# Patient Record
Sex: Male | Born: 1995
Health system: Southern US, Community
[De-identification: ages and names within clinical notes are randomized; demographics above are authoritative.]

---
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
## 2016-10-31 ENCOUNTER — Ambulatory Visit (INDEPENDENT_AMBULATORY_CARE_PROVIDER_SITE_OTHER): Payer: 59 | Admitting: Family Medicine

## 2016-10-31 ENCOUNTER — Encounter: Payer: Self-pay | Admitting: Family Medicine

## 2016-10-31 VITALS — BP 127/77 | HR 73 | Temp 97.5°F | Ht 68.0 in | Wt 243.6 lb

## 2016-10-31 DIAGNOSIS — Z0000 Encounter for general adult medical examination without abnormal findings: Secondary | ICD-10-CM | POA: Diagnosis not present

## 2016-10-31 DIAGNOSIS — H0260 Xanthelasma of unspecified eye, unspecified eyelid: Secondary | ICD-10-CM

## 2016-10-31 DIAGNOSIS — F411 Generalized anxiety disorder: Secondary | ICD-10-CM

## 2016-10-31 NOTE — Progress Notes (Signed)
BP 127/77   Pulse 73   Temp 97.5 F (36.4 C) (Oral)   Ht 5' 8"  (1.727 m)   Wt 243 lb 9.6 oz (110.5 kg)   BMI 37.04 kg/m    Subjective:    Patient ID: Thomas Mercer, male    DOB: November 17, 1995, 21 y.o.   MRN: [id number]  HPI: Thomas Mercer is a 21 y.o. male presenting on 10/31/2016 for Annual Exam   HPI Well adult exam Patient is coming in today for well adult exam. He has one spot on his upper eyelid that he wants to get looked at because he does not know what it is now has been there at least 4 or 5 months. He denies any pain or redness or warmth from the site or drainage from the site. He denies any chest pain, shortness of breath, headaches or vision issues, abdominal complaints, diarrhea, nausea, vomiting, or joint issues.   Anxiety Patient does say that he has some amount of anxiety and that he is a worry prone person but he says it is not to the point where it keeps him from functioning in his life or lifestyle. He does say that it occasionally he wakes up at night because of the anxiety but does not feel as to the point where he needs medication or counseling to help with that. He denies any feelings of depression or sadness or suicidal ideations or feelings of hopelessness or helplessness. He would like to get some labs done as routine to make sure he doesn't have thyroid disease which runs in his family.  Relevant past medical, surgical, family and social history reviewed and updated as indicated. Interim medical history since our last visit reviewed. Allergies and medications reviewed and updated.  Review of Systems  Constitutional: Negative for chills and fever.  HENT: Negative for congestion, ear pain and tinnitus.   Eyes: Negative for pain.  Respiratory: Negative for cough, shortness of breath and wheezing.   Cardiovascular: Negative for chest pain, palpitations and leg swelling.  Gastrointestinal: Negative for abdominal pain, blood in stool, constipation and  diarrhea.  Genitourinary: Negative for dysuria and hematuria.  Musculoskeletal: Negative for back pain and myalgias.  Skin: Negative for rash.  Neurological: Negative for dizziness, weakness and headaches.  Psychiatric/Behavioral: Positive for sleep disturbance. Negative for decreased concentration, dysphoric mood, self-injury and suicidal ideas. The patient is nervous/anxious.     Per HPI unless specifically indicated above   Allergies as of 10/31/2016   No Known Allergies     Medication List    as of 10/31/2016  5:09 PM   You have not been prescribed any medications.        Objective:    BP 127/77   Pulse 73   Temp 97.5 F (36.4 C) (Oral)   Ht 5' 8"  (1.727 m)   Wt 243 lb 9.6 oz (110.5 kg)   BMI 37.04 kg/m   Wt Readings from Last 3 Encounters:  10/31/16 243 lb 9.6 oz (110.5 kg)    Physical Exam  Constitutional: He is oriented to person, place, and time. He appears well-developed and well-nourished. No distress.  HENT:  Right Ear: External ear normal.  Left Ear: External ear normal.  Nose: Nose normal.  Mouth/Throat: Oropharynx is clear and moist. No oropharyngeal exudate.  Eyes: Conjunctivae and EOM are normal. Pupils are equal, round, and reactive to light. Right eye exhibits no discharge. No scleral icterus.  Neck: Neck supple. No thyromegaly present.  Cardiovascular: Normal rate, regular  rhythm, normal heart sounds and intact distal pulses.   No murmur heard. Pulmonary/Chest: Effort normal and breath sounds normal. No respiratory distress. He has no wheezes.  Abdominal: Soft. Bowel sounds are normal. He exhibits no distension. There is no tenderness. There is no rebound and no guarding.  Musculoskeletal: Normal range of motion. He exhibits no edema.  Lymphadenopathy:    He has no cervical adenopathy.  Neurological: He is alert and oriented to person, place, and time. Coordination normal.  Skin: Skin is warm and dry. Lesion (She'll showed cutaneous mobile soft  nodule in right upper outer eyelid. Less than 0.2 cm in diameter. Nontender to palpation, no erythema or drainage and no signs of opening in under eyelid either.) noted. No rash noted. He is not diaphoretic.  Psychiatric: His behavior is normal. Judgment normal. His mood appears anxious. He does not exhibit a depressed mood. He expresses no suicidal ideation. He expresses no suicidal plans.  Vitals reviewed.   No results found for this or any previous visit.    Assessment & Plan:   Problem List Items Addressed This Visit    None    Visit Diagnoses    Well adult exam    -  Primary   Relevant Orders   CMP14+EGFR (Completed)   Lipid panel (Completed)   Xanthelasma       Anxiety state       Relevant Orders   CBC with Differential/Platelet (Completed)   TSH (Completed)       Follow up plan: Return if symptoms worsen or fail to improve.  Counseling provided for all of the vaccine components Orders Placed This Encounter  Procedures  . CMP14+EGFR  . CBC with Differential/Platelet  . TSH  . Lipid panel    Caryl Pina, MD [facility] 10/31/2016, 5:09 PM

## 2016-11-01 LAB — CBC WITH DIFFERENTIAL/PLATELET
BASOS ABS: 0.1 10*3/uL (ref 0.0–0.2)
Basos: 0 %
EOS (ABSOLUTE): 0.1 10*3/uL (ref 0.0–0.4)
Eos: 1 %
HEMOGLOBIN: 13.7 g/dL (ref 13.0–17.7)
Hematocrit: 40.8 % (ref 37.5–51.0)
Immature Grans (Abs): 0 10*3/uL (ref 0.0–0.1)
Immature Granulocytes: 0 %
LYMPHS ABS: 4.3 10*3/uL — AB (ref 0.7–3.1)
Lymphs: 27 %
MCH: 27.8 pg (ref 26.6–33.0)
MCHC: 33.6 g/dL (ref 31.5–35.7)
MCV: 83 fL (ref 79–97)
MONOCYTES: 9 %
MONOS ABS: 1.4 10*3/uL — AB (ref 0.1–0.9)
NEUTROS PCT: 63 %
Neutrophils Absolute: 9.8 10*3/uL — ABNORMAL HIGH (ref 1.4–7.0)
Platelets: 486 10*3/uL — ABNORMAL HIGH (ref 150–379)
RBC: 4.93 x10E6/uL (ref 4.14–5.80)
RDW: 13 % (ref 12.3–15.4)
WBC: 15.6 10*3/uL — AB (ref 3.4–10.8)

## 2016-11-01 LAB — CMP14+EGFR
ALBUMIN: 5.1 g/dL (ref 3.5–5.5)
ALK PHOS: 90 IU/L (ref 39–117)
ALT: 37 IU/L (ref 0–44)
AST: 26 IU/L (ref 0–40)
Albumin/Globulin Ratio: 1.6 (ref 1.2–2.2)
BILIRUBIN TOTAL: 0.6 mg/dL (ref 0.0–1.2)
BUN / CREAT RATIO: 18 (ref 9–20)
BUN: 13 mg/dL (ref 6–20)
CO2: 24 mmol/L (ref 18–29)
Calcium: 10.2 mg/dL (ref 8.7–10.2)
Chloride: 98 mmol/L (ref 96–106)
Creatinine, Ser: 0.72 mg/dL — ABNORMAL LOW (ref 0.76–1.27)
GFR calc Af Amer: 155 mL/min/{1.73_m2} (ref >59)
GFR calc non Af Amer: 134 mL/min/{1.73_m2} (ref >59)
GLUCOSE: 88 mg/dL (ref 65–99)
Globulin, Total: 3.1 g/dL (ref 1.5–4.5)
Potassium: 4.3 mmol/L (ref 3.5–5.2)
Sodium: 140 mmol/L (ref 134–144)
Total Protein: 8.2 g/dL (ref 6.0–8.5)

## 2016-11-01 LAB — LIPID PANEL
CHOLESTEROL TOTAL: 162 mg/dL (ref 100–199)
Chol/HDL Ratio: 3.9 ratio units (ref 0.0–5.0)
HDL: 42 mg/dL (ref >39)
LDL Calculated: 89 mg/dL (ref 0–99)
Triglycerides: 156 mg/dL — ABNORMAL HIGH (ref 0–149)
VLDL Cholesterol Cal: 31 mg/dL (ref 5–40)

## 2016-11-01 LAB — TSH: TSH: 4.01 u[IU]/mL (ref 0.450–4.500)

## 2016-11-03 ENCOUNTER — Encounter: Payer: Self-pay | Admitting: Family Medicine

## 2016-12-02 ENCOUNTER — Encounter: Payer: Self-pay | Admitting: Family Medicine

## 2016-12-02 ENCOUNTER — Other Ambulatory Visit: Payer: Self-pay

## 2016-12-02 ENCOUNTER — Other Ambulatory Visit: Payer: 59

## 2016-12-02 DIAGNOSIS — D72829 Elevated white blood cell count, unspecified: Secondary | ICD-10-CM | POA: Diagnosis not present

## 2016-12-02 DIAGNOSIS — R5383 Other fatigue: Secondary | ICD-10-CM

## 2016-12-03 ENCOUNTER — Other Ambulatory Visit: Payer: Self-pay

## 2016-12-03 ENCOUNTER — Telehealth: Payer: Self-pay | Admitting: Family Medicine

## 2016-12-03 DIAGNOSIS — R5383 Other fatigue: Secondary | ICD-10-CM | POA: Diagnosis not present

## 2016-12-03 LAB — CBC WITH DIFFERENTIAL/PLATELET
Basophils Absolute: 0.1 10*3/uL (ref 0.0–0.2)
Basos: 0 %
EOS (ABSOLUTE): 0.1 10*3/uL (ref 0.0–0.4)
EOS: 1 %
HEMATOCRIT: 42.3 % (ref 37.5–51.0)
Hemoglobin: 14.4 g/dL (ref 13.0–17.7)
Immature Grans (Abs): 0 10*3/uL (ref 0.0–0.1)
Immature Granulocytes: 0 %
LYMPHS ABS: 4.3 10*3/uL — AB (ref 0.7–3.1)
Lymphs: 38 %
MCH: 28.9 pg (ref 26.6–33.0)
MCHC: 34 g/dL (ref 31.5–35.7)
MCV: 85 fL (ref 79–97)
MONOS ABS: 0.9 10*3/uL (ref 0.1–0.9)
Monocytes: 8 %
Neutrophils Absolute: 6.1 10*3/uL (ref 1.4–7.0)
Neutrophils: 53 %
PLATELETS: 446 10*3/uL — AB (ref 150–379)
RBC: 4.98 x10E6/uL (ref 4.14–5.80)
RDW: 13.5 % (ref 12.3–15.4)
WBC: 11.4 10*3/uL — AB (ref 3.4–10.8)

## 2016-12-03 NOTE — Telephone Encounter (Signed)
I do think that they should keep the appointment with me on Friday we can discuss things more, the reason I am sending them to the hematologist is to make sure that things like leukemia are not a possibility, I do not think based on the numbers that that's what were seen but that's what I want to rule out. But discussing anxiety on Friday would be a good idea

## 2016-12-03 NOTE — Telephone Encounter (Signed)
Mother advised, will come in Friday to see Dr. Warrick Parisian

## 2016-12-03 NOTE — Telephone Encounter (Signed)
Mom thought of a couple more questions after speaking with you.  She said Thomas Mercer has an appointment with you on Friday to discuss anxiety and his labwork, do you think they should keep this appointment?  And, they are very concerned about what could be wrong.  Thomas Mercer has been looking up things on the internet and everything he sees is bad.  I tried to reassure her that an elevated WBC count and platelets are not always something horrible but she wants to know if you think it could be something that's not as serious as leukemia.  Basically wants your reassurance that this can happen and it isn't always something horrible.  Please advise.

## 2016-12-04 ENCOUNTER — Other Ambulatory Visit: Payer: Self-pay

## 2016-12-04 ENCOUNTER — Telehealth: Payer: Self-pay | Admitting: Family Medicine

## 2016-12-04 DIAGNOSIS — D72829 Elevated white blood cell count, unspecified: Secondary | ICD-10-CM

## 2016-12-04 LAB — VITAMIN B12: VITAMIN B 12: 533 pg/mL (ref 232–1245)

## 2016-12-04 LAB — VITAMIN D 25 HYDROXY (VIT D DEFICIENCY, FRACTURES): VIT D 25 HYDROXY: 26.3 ng/mL — AB (ref 30.0–100.0)

## 2016-12-04 NOTE — Telephone Encounter (Signed)
What type of referral do you need? Hematologist  Have you been seen at our office for this problem? Yes  (If no, schedule them an appointment.  They will need to be seen before a referral can be done.)  Is there a particular doctor or location that you prefer? No but need one ASAP  Patient notified that referrals can take up to a week or longer to process. If they haven't heard anything within a week they should call back and speak with the referral department.

## 2016-12-04 NOTE — Telephone Encounter (Signed)
Pt aware to be expecting a phone call from [facility]

## 2016-12-05 ENCOUNTER — Encounter: Payer: Self-pay | Admitting: Family Medicine

## 2016-12-05 ENCOUNTER — Ambulatory Visit (INDEPENDENT_AMBULATORY_CARE_PROVIDER_SITE_OTHER): Payer: 59 | Admitting: Family Medicine

## 2016-12-05 ENCOUNTER — Ambulatory Visit: Payer: 59 | Admitting: Family Medicine

## 2016-12-05 VITALS — BP 129/75 | HR 72 | Temp 98.2°F | Ht 68.0 in | Wt 239.0 lb

## 2016-12-05 DIAGNOSIS — F411 Generalized anxiety disorder: Secondary | ICD-10-CM

## 2016-12-05 MED ORDER — BUSPIRONE HCL 10 MG PO TABS: 10.0000 mg | ORAL_TABLET | Freq: Two times a day (BID) | ORAL | 1 refills | Status: DC

## 2016-12-05 MED ORDER — TRAZODONE HCL 50 MG PO TABS: 25.0000 mg | ORAL_TABLET | Freq: Every evening | ORAL | 3 refills | Status: DC | PRN

## 2016-12-05 NOTE — Progress Notes (Signed)
BP 129/75   Pulse 72   Temp 98.2 F (36.8 C) (Oral)   Ht 5\' 8"  (1.727 m)   Wt 239 lb (108.4 kg)   BMI 36.34 kg/m    Subjective:    Patient ID: Thomas Mercer, male    DOB: 01/17/1996, 21 y.o.   MRN: [id number]  HPI: Thomas Mercer is a 21 y.o. male presenting on 12/05/2016 for Anxiety (x 2 weeks )   HPI Anxiety state patient has had a persistently elevated white blood cell count and is coming in today because he has been having  Lot of anxiety more than normal because of this that he is dealing with.e denies any suicidal ideations.     Depression screen [facility] 2/9 12/05/2016 10/31/2016  Decreased Interest 2 0  Down, Depressed, Hopeless 2 1  PHQ - 2 Score 4 1  Altered sleeping 2 -  Tired, decreased energy 2 -  Change in appetite 0 -  Feeling bad or failure about yourself  0 -  Trouble concentrating 0 -  Moving slowly or fidgety/restless 0 -  Suicidal thoughts 0 -  PHQ-9 Score 8 -    Feeling nervous, anxious or on edge 2 Not being able to stop or control worrying 2 Worrying too much about different things 2 Trouble relaxing 2 Being so restless that it is hard to sit still 2 Becoming easily annoyed or irritable 1 Feeling afraid as if something awful might happen 3         Total Score 14   Relevant past medical, surgical, family and social history reviewed and updated as indicated. Interim medical history since our last visit reviewed. Allergies and medications reviewed and updated.  Review of Systems  Constitutional: Negative for chills and fever.  Respiratory: Negative for shortness of breath and wheezing.   Cardiovascular: Negative for chest pain and leg swelling.  Musculoskeletal: Negative for back pain and gait problem.  Skin: Negative for rash.  Psychiatric/Behavioral: Positive for dysphoric mood and sleep disturbance. Negative for decreased concentration, self-injury and suicidal ideas. The patient is nervous/anxious.   All other systems reviewed and are  negative.   Per HPI unless specifically indicated above      Objective:    BP 129/75   Pulse 72   Temp 98.2 F (36.8 C) (Oral)   Ht 5\' 8"  (1.727 m)   Wt 239 lb (108.4 kg)   BMI 36.34 kg/m   Wt Readings from Last 3 Encounters:  12/05/16 239 lb (108.4 kg)  10/31/16 243 lb 9.6 oz (110.5 kg)    Physical Exam  Constitutional: He is oriented to person, place, and time. He appears well-developed and well-nourished. No distress.  Eyes: Conjunctivae are normal. Right eye exhibits no discharge. Left eye exhibits no discharge. No scleral icterus.  Neck: Neck supple. No thyromegaly present.  Musculoskeletal: Normal range of motion. He exhibits no edema.  Lymphadenopathy:    He has no cervical adenopathy.  Neurological: He is alert and oriented to person, place, and time. Coordination normal.  Skin: Skin is warm and dry. No rash noted. He is not diaphoretic.  Psychiatric: His behavior is normal. Thought content normal. His mood appears anxious. He does not exhibit a depressed mood. He expresses no suicidal ideation. He expresses no suicidal plans.  Nursing note and vitals reviewed.     Assessment & Plan:   Problem List Items Addressed This Visit    None    Visit Diagnoses    Anxiety state    -  Primary   Relevant Medications   busPIRone (BUSPAR) 10 MG tablet   traZODone (DESYREL) 50 MG tablet       Follow up plan: Return in about 2 months (around 02/02/2017), or if symptoms worsen or fail to improve, for Follow-up anxiety.  Counseling provided for all of the vaccine components No orders of the defined types were placed in this encounter.   Caryl Pina, MD [facility] 12/05/2016, 4:45 PM

## 2016-12-09 ENCOUNTER — Encounter (HOSPITAL_COMMUNITY): Payer: 59 | Attending: Oncology | Admitting: Oncology

## 2016-12-09 ENCOUNTER — Encounter (HOSPITAL_COMMUNITY): Payer: Self-pay

## 2016-12-09 ENCOUNTER — Encounter (HOSPITAL_COMMUNITY): Payer: 59

## 2016-12-09 DIAGNOSIS — Z79899 Other long term (current) drug therapy: Secondary | ICD-10-CM | POA: Diagnosis not present

## 2016-12-09 DIAGNOSIS — D75838 Other thrombocytosis: Secondary | ICD-10-CM | POA: Insufficient documentation

## 2016-12-09 DIAGNOSIS — R7989 Other specified abnormal findings of blood chemistry: Secondary | ICD-10-CM | POA: Insufficient documentation

## 2016-12-09 DIAGNOSIS — E559 Vitamin D deficiency, unspecified: Secondary | ICD-10-CM | POA: Insufficient documentation

## 2016-12-09 DIAGNOSIS — D473 Essential (hemorrhagic) thrombocythemia: Secondary | ICD-10-CM | POA: Diagnosis not present

## 2016-12-09 DIAGNOSIS — D72829 Elevated white blood cell count, unspecified: Secondary | ICD-10-CM | POA: Diagnosis not present

## 2016-12-09 LAB — FERRITIN: Ferritin: 128 ng/mL (ref 24–336)

## 2016-12-09 LAB — CBC WITH DIFFERENTIAL/PLATELET
Basophils Absolute: 0 10*3/uL (ref 0.0–0.1)
Basophils Relative: 1 %
EOS PCT: 1 %
Eosinophils Absolute: 0.1 10*3/uL (ref 0.0–0.7)
HEMATOCRIT: 43.6 % (ref 39.0–52.0)
Hemoglobin: 15.4 g/dL (ref 13.0–17.0)
LYMPHS ABS: 2.6 10*3/uL (ref 0.7–4.0)
LYMPHS PCT: 30 %
MCH: 29.7 pg (ref 26.0–34.0)
MCHC: 35.3 g/dL (ref 30.0–36.0)
MCV: 84.2 fL (ref 78.0–100.0)
MONO ABS: 0.8 10*3/uL (ref 0.1–1.0)
MONOS PCT: 9 %
NEUTROS ABS: 5.2 10*3/uL (ref 1.7–7.7)
Neutrophils Relative %: 59 %
PLATELETS: 451 10*3/uL — AB (ref 150–400)
RBC: 5.18 MIL/uL (ref 4.22–5.81)
RDW: 12.8 % (ref 11.5–15.5)
WBC: 8.7 10*3/uL (ref 4.0–10.5)

## 2016-12-09 LAB — IRON AND TIBC
IRON: 110 ug/dL (ref 45–182)
SATURATION RATIOS: 23 % (ref 17.9–39.5)
TIBC: 472 ug/dL — AB (ref 250–450)
UIBC: 362 ug/dL

## 2016-12-09 LAB — COMPREHENSIVE METABOLIC PANEL
ALT: 33 U/L (ref 17–63)
ANION GAP: 8 (ref 5–15)
AST: 26 U/L (ref 15–41)
Albumin: 4.8 g/dL (ref 3.5–5.0)
Alkaline Phosphatase: 76 U/L (ref 38–126)
BILIRUBIN TOTAL: 0.9 mg/dL (ref 0.3–1.2)
BUN: 12 mg/dL (ref 6–20)
CHLORIDE: 102 mmol/L (ref 101–111)
CO2: 26 mmol/L (ref 22–32)
Calcium: 10 mg/dL (ref 8.9–10.3)
Creatinine, Ser: 0.67 mg/dL (ref 0.61–1.24)
Glucose, Bld: 109 mg/dL — ABNORMAL HIGH (ref 65–99)
Potassium: 4.3 mmol/L (ref 3.5–5.1)
Sodium: 136 mmol/L (ref 135–145)
TOTAL PROTEIN: 8.6 g/dL — AB (ref 6.5–8.1)

## 2016-12-09 LAB — LACTATE DEHYDROGENASE: LDH: 112 U/L (ref 98–192)

## 2016-12-09 NOTE — Patient Instructions (Signed)
[place] at [facility] Discharge Instructions  RECOMMENDATIONS MADE BY THE CONSULTANT AND ANY TEST RESULTS WILL BE SENT TO YOUR REFERRING PHYSICIAN.  You were seen today by Dr. Twana First Follow up in clinic in 10 days to review labs See Amy up front for appointments   Thank you for choosing [place] at [facility] to provide your oncology and hematology care.  To afford each patient quality time with our provider, please arrive at least 15 minutes before your scheduled appointment time.    If you have a lab appointment with the [facility] please come in thru the  Main Entrance and check in at the main information desk  You need to re-schedule your appointment should you arrive 10 or more minutes late.  We strive to give you quality time with our providers, and arriving late affects you and other patients whose appointments are after yours.  Also, if you no show three or more times for appointments you may be dismissed from the clinic at the providers discretion.     Again, thank you for choosing [facility].  Our hope is that these requests will decrease the amount of time that you wait before being seen by our physicians.       _____________________________________________________________  Should you have questions after your visit to [facility], please contact our office at [phone] between the hours of 8:30 a.m. and 4:30 p.m.  Voicemails left after 4:30 p.m. will not be returned until the following business day.  For prescription refill requests, have your pharmacy contact our office.       Resources For Cancer Patients and their Caregivers ? American Cancer Society: Can assist with transportation, wigs, general needs, runs Look Good Feel Better.        (602) 080-5547 ? Cancer Care: Provides financial assistance, online support groups, medication/co-pay assistance.  1-800-813-HOPE  614 640 2224) ? [facility] Assists [place] cancer patients and their families through emotional , educational and financial support.  949-521-8841 ? [place] DSS Where to apply for food stamps, Medicaid and utility assistance. 508-470-0510 ? [facility]: Transportation to medical appointments. (304) 150-8366 ? Social Security Administration: May apply for disability if have a Stage IV cancer. 3315555669 248-647-2762 ? [employer], Disability and Transit Services: Assists with nutrition, care and transit needs. [place] Support Programs: @10RELATIVEDAYS @ > Cancer Support Group  2nd Tuesday of the month 1pm-2pm, Journey Room  > Creative Journey  3rd Tuesday of the month 1130am-1pm, Journey Room  > Look Good Feel Better  1st Wednesday of the month 10am-12 noon, Journey Room (Call [place] to register [phone])

## 2016-12-09 NOTE — Progress Notes (Signed)
[place] NOTE  Patient Care Team: Thomas Rancher, MD as PCP - General (Family Medicine)  CHIEF COMPLAINTS/PURPOSE OF CONSULTATION:  Leukocytosis  HISTORY OF PRESENTING ILLNESS:  Thomas Mercer 21 y.o. male is here because of referral by Dr. Warrick Parisian for leukocytosis.  He had routine CBC performed on 10/31/16 which demonstrated a WBC of 15.7k, hemoglobin 13.7 g/dl, plt 486k. He was told that he "needed to be ruled out for leukemia".  His most recent lab results on 12/02/2016 show his WBC at 11,400/uL, HCT at 42.3%, MCV at 85 fL, and platelets 446,000/ uL.   Patient presents today accompanied by his mother for consultation about his leukocytosis. I personally reviewed and went over labs with the patient.  He states he has not had any recent infections. He does note he has a cavity that needs to be filled. States he has not been on any antibiotics recently.   The mother notes that her son has been very anxious about possibility of leukemia. Since hearing about possibly having leukemia from his PCP he has not been able to sleep, has been having heart palpitations, and been anxious .   He is currently taking 5000 units of Vitamin D x2 a day for vitamin D deficiency, but otherwise does not take any medications.  Denies cough, fever, unexplained weight loss, night sweats, diarhrea, GI issues, abdominal pain, and leg swelling.  Patient states he doesn't smoke.    MEDICAL HISTORY:  No past medical history on file. None  SURGICAL HISTORY: No past surgical history on file. None  SOCIAL HISTORY: Social History   Social History  . Marital status: Single    Spouse name: N/A  . Number of children: N/A  . Years of education: N/A   Occupational History  . Not on file.   Social History Main Topics  . Smoking status: Never Smoker  . Smokeless tobacco: Never Used  . Alcohol use No  . Drug use: No  . Sexual activity: No   Other Topics Concern  .  Not on file   Social History Narrative  . No narrative on file    FAMILY HISTORY: Family History  Problem Relation Age of Onset  . Diabetes Father   . Hypertension Father   . Cancer Maternal Grandfather     renal    ALLERGIES:  has No Known Allergies.  MEDICATIONS:  Current Outpatient Prescriptions  Medication Sig Dispense Refill  . busPIRone (BUSPAR) 10 MG tablet Take 1 tablet (10 mg total) by mouth 2 (two) times daily. 60 tablet 1  . traZODone (DESYREL) 50 MG tablet Take 0.5-1 tablets (25-50 mg total) by mouth at bedtime as needed for sleep. 30 tablet 3   No current facility-administered medications for this visit.      Review of Systems  Constitutional: Negative.  Negative for fever and weight loss.       Denies night sweats  HENT: Negative.   Eyes: Negative.   Respiratory: Negative.  Negative for cough.   Cardiovascular: Positive for palpitations (due to anxiety). Negative for leg swelling.  Gastrointestinal: Negative.  Negative for abdominal pain and diarrhea.       Denies GI issues.  Genitourinary: Negative.   Musculoskeletal: Negative.   Skin: Negative.   Neurological: Negative.   Endo/Heme/Allergies: Negative.   Psychiatric/Behavioral: The patient is nervous/anxious and has insomnia (due to anxiety).   All other systems reviewed and are negative.  14 point ROS was done and is otherwise  as detailed above or in HPI   PHYSICAL EXAMINATION:   Vitals:   12/09/16 0856  BP: (!) 164/67  Pulse: 68  Resp: 18  Temp: 98.3 F (36.8 C)   Filed Weights   12/09/16 0856  Weight: 238 lb 4.8 oz (108.1 kg)     Physical Exam  Constitutional: He is oriented to person, place, and time and well-developed, well-nourished, and in no distress. No distress.  HENT:  Head: Normocephalic and atraumatic.  Mouth/Throat: No oropharyngeal exudate.  Eyes: Conjunctivae and EOM are normal. Pupils are equal, round, and reactive to light. No scleral icterus.  Neck: Normal range  of motion. Neck supple. No JVD present.  Cardiovascular: Normal rate, regular rhythm and normal heart sounds.  Exam reveals no gallop and no friction rub.   No murmur heard. Pulmonary/Chest: Effort normal and breath sounds normal. No respiratory distress. He has no wheezes. He has no rales.  Abdominal: Soft. Bowel sounds are normal. He exhibits no distension. There is no hepatosplenomegaly. There is no tenderness. There is no guarding.  Musculoskeletal: Normal range of motion. He exhibits no edema or tenderness.  Lymphadenopathy:       Head (right side): No submental, no submandibular, no tonsillar, no preauricular, no posterior auricular and no occipital adenopathy present.       Head (left side): No submental, no submandibular, no tonsillar, no preauricular, no posterior auricular and no occipital adenopathy present.    He has no cervical adenopathy.       Right cervical: No superficial cervical, no deep cervical and no posterior cervical adenopathy present.      Left cervical: No superficial cervical, no deep cervical and no posterior cervical adenopathy present.    He has no axillary adenopathy.       Right: No supraclavicular adenopathy present.       Left: No supraclavicular adenopathy present.  Neurological: He is alert and oriented to person, place, and time. No cranial nerve deficit. Gait normal.  Skin: Skin is warm and dry. No rash noted. No erythema. No pallor.  Psychiatric: Affect and judgment normal.  Nursing note and vitals reviewed.     LABORATORY DATA:  I have reviewed the data as listed Lab Results  Component Value Date   WBC 11.4 (H) 12/02/2016   HCT 42.3 12/02/2016   MCV 85 12/02/2016   PLT 446 (H) 12/02/2016   CMP     Component Value Date/Time   NA 140 10/31/2016 1711   K 4.3 10/31/2016 1711   CL 98 10/31/2016 1711   CO2 24 10/31/2016 1711   GLUCOSE 88 10/31/2016 1711   BUN 13 10/31/2016 1711   CREATININE 0.72 (L) 10/31/2016 1711   CALCIUM 10.2 10/31/2016  1711   PROT 8.2 10/31/2016 1711   ALBUMIN 5.1 10/31/2016 1711   AST 26 10/31/2016 1711   ALT 37 10/31/2016 1711   ALKPHOS 90 10/31/2016 1711   BILITOT 0.6 10/31/2016 1711   GFRNONAA 134 10/31/2016 1711   GFRAA 155 10/31/2016 1711     RADIOGRAPHIC STUDIES: I have personally reviewed the radiological images as listed and agreed with the findings in the report. No results found.  ASSESSMENT & PLAN:  21 year old male presents for evaluation of new leukocytosis, which has been trending down. He also has a thrombocytosis which is likely reactive.   Labs reviewed. Results noted above.   Suspect his leukocytosis is secondary to his dental caries. Reccommended the patient to see his dentist ASAP. This is not  leukemia, reassured patient and his mother.   Labs ordered: CBC, CMP, iron studies, JAK-2 mutation with reflex to MPL and CALR, peripheral smear review by pathology, LDH, and BCR-ABL.  RTC in 10 days for follow up and review of above lab results.     All questions were answered. The patient knows to call the clinic with any problems, questions or concerns.  This document serves as a record of services personally performed by Twana First, MD. It was created on her behalf by Shirlean Mylar, a trained medical scribe. The creation of this record is based on the scribe's personal observations and the provider's statements to them. This document has been checked and approved by the attending provider.  I have reviewed the above documentation for accuracy and completeness and I agree with the above.   This note was electronically signed.    [employer]  12/09/2016 8:58 AM

## 2016-12-10 ENCOUNTER — Encounter (HOSPITAL_COMMUNITY): Payer: Self-pay | Admitting: Oncology

## 2016-12-11 LAB — PATHOLOGIST SMEAR REVIEW

## 2016-12-15 ENCOUNTER — Encounter (HOSPITAL_COMMUNITY): Payer: Self-pay | Admitting: Oncology

## 2016-12-15 ENCOUNTER — Telehealth (HOSPITAL_COMMUNITY): Payer: Self-pay

## 2016-12-15 NOTE — Telephone Encounter (Signed)
Called patients mom and explained what Dr. Talbert Cage had said concerning his labs. She verbalized understanding and states they will keep follow up appt.for this Friday.

## 2016-12-15 NOTE — Telephone Encounter (Signed)
Patients mom called because the patient is having a lot of anxiety concerning his lab results. Why are his platelets elevated? Do they need to be concerned about this? I tried to explain to mom that his labs looked good but she states the patient is losing sleep at night because of anxiety over the lab results. They have also sent Dr. Talbert Cage an email today about the same thing. Please advise what to tell patients mom and patient to relieve their anxiety.

## 2016-12-15 NOTE — Telephone Encounter (Signed)
Tell them he definitely does not have leukemia. WBC count is back down to normal. Platelets is only mildly elevated and its likely acting as an acute phase reactant in response to his dental cavities. He needs to get his teeth fixed.

## 2016-12-16 ENCOUNTER — Encounter: Payer: Self-pay | Admitting: Family Medicine

## 2016-12-16 ENCOUNTER — Telehealth: Payer: Self-pay | Admitting: Family Medicine

## 2016-12-16 DIAGNOSIS — F419 Anxiety disorder, unspecified: Secondary | ICD-10-CM

## 2016-12-16 LAB — BCR-ABL1, CML/ALL, PCR, QUANT

## 2016-12-16 MED ORDER — ESCITALOPRAM OXALATE 10 MG PO TABS: 10.0000 mg | ORAL_TABLET | Freq: Every day | ORAL | 5 refills | Status: DC

## 2016-12-16 NOTE — Telephone Encounter (Signed)
Patient still having increased anxiety, I Lexapro for him and want him to see me back in 4 weeks

## 2016-12-17 NOTE — Telephone Encounter (Signed)
Mom aware per dpr and wants to know if he needs to d/c the Buspar? Please advise and send back to the pools.

## 2016-12-17 NOTE — Telephone Encounter (Signed)
He can either discontinue it or keep it going, there is no interaction with that and realistically either way is fine. If he does not feel like it's doing anything been going ahead and stop

## 2016-12-17 NOTE — Telephone Encounter (Signed)
Aware. 

## 2016-12-18 LAB — CALR + JAK2 E12-15 + MPL (REFLEXED)

## 2016-12-18 LAB — JAK2 V617F, W REFLEX TO CALR/E12/MPL

## 2016-12-19 ENCOUNTER — Encounter (HOSPITAL_COMMUNITY): Payer: Self-pay

## 2016-12-19 ENCOUNTER — Encounter (HOSPITAL_COMMUNITY): Payer: 59 | Attending: Oncology | Admitting: Oncology

## 2016-12-19 VITALS — BP 140/82 | HR 69 | Temp 98.3°F | Resp 18 | Wt 239.7 lb

## 2016-12-19 DIAGNOSIS — D473 Essential (hemorrhagic) thrombocythemia: Secondary | ICD-10-CM

## 2016-12-19 DIAGNOSIS — D72829 Elevated white blood cell count, unspecified: Secondary | ICD-10-CM

## 2016-12-19 DIAGNOSIS — R7989 Other specified abnormal findings of blood chemistry: Secondary | ICD-10-CM

## 2016-12-19 DIAGNOSIS — D72828 Other elevated white blood cell count: Secondary | ICD-10-CM

## 2016-12-19 DIAGNOSIS — D75838 Other thrombocytosis: Secondary | ICD-10-CM

## 2016-12-19 NOTE — Patient Instructions (Signed)
[place] at [facility] Discharge Instructions  RECOMMENDATIONS MADE BY THE CONSULTANT AND ANY TEST RESULTS WILL BE SENT TO YOUR REFERRING PHYSICIAN.  You were seen today by Dr. Twana First Follow up as needed See Amy up front for appointments   Thank you for choosing [place] at [facility] to provide your oncology and hematology care.  To afford each patient quality time with our provider, please arrive at least 15 minutes before your scheduled appointment time.    If you have a lab appointment with the [facility] please come in thru the  Main Entrance and check in at the main information desk  You need to re-schedule your appointment should you arrive 10 or more minutes late.  We strive to give you quality time with our providers, and arriving late affects you and other patients whose appointments are after yours.  Also, if you no show three or more times for appointments you may be dismissed from the clinic at the providers discretion.     Again, thank you for choosing [facility].  Our hope is that these requests will decrease the amount of time that you wait before being seen by our physicians.       _____________________________________________________________  Should you have questions after your visit to [facility], please contact our office at [phone] between the hours of 8:30 a.m. and 4:30 p.m.  Voicemails left after 4:30 p.m. will not be returned until the following business day.  For prescription refill requests, have your pharmacy contact our office.       Resources For Cancer Patients and their Caregivers ? American Cancer Society: Can assist with transportation, wigs, general needs, runs Look Good Feel Better.        ([phone] ? Cancer Care: Provides financial assistance, online support groups, medication/co-pay assistance.  1-800-813-HOPE 414-116-4200) ? [facility] Assists [place] cancer patients and their families through emotional , educational and financial support.  4795240409 ? [place] DSS Where to apply for food stamps, Medicaid and utility assistance. 907-668-7201 ? [employer]: Transportation to medical appointments. 714-604-9452 ? Social Security Administration: May apply for disability if have a Stage IV cancer. 408-836-6098 838-252-4906 ? [employer], Disability and Transit Services: Assists with nutrition, care and transit needs. [place] Support Programs: @10RELATIVEDAYS @ > Cancer Support Group  2nd Tuesday of the month 1pm-2pm, Journey Room  > Creative Journey  3rd Tuesday of the month 1130am-1pm, Journey Room  > Look Good Feel Better  1st Wednesday of the month 10am-12 noon, Journey Room (Call [place] to register [phone])

## 2016-12-19 NOTE — Progress Notes (Signed)
[place]  PROGRESS NOTE  Patient Care Team: Worthy Rancher, MD as PCP - General (Family Medicine)  CHIEF COMPLAINTS/PURPOSE OF CONSULTATION:  Leukocytosis  HISTORY OF PRESENTING ILLNESS:  Thomas Mercer 21 y.o. male is here for follow up of leukocytosis.  He is doing well today. He has been having some lower back pain, muscular in nature. He does a lot of heavy lifting at work. His mother states that the patient has a lot of health related anxiety from over researching on the internet. Denies any other concerns. He has an appointment for his dental problems coming up.   MEDICAL HISTORY:  No past medical history on file. None  SURGICAL HISTORY: No past surgical history on file. None  SOCIAL HISTORY: Social History   Social History  . Marital status: Single    Spouse name: N/A  . Number of children: N/A  . Years of education: N/A   Occupational History  . Not on file.   Social History Main Topics  . Smoking status: Never Smoker  . Smokeless tobacco: Never Used  . Alcohol use No  . Drug use: No  . Sexual activity: No   Other Topics Concern  . Not on file   Social History Narrative  . No narrative on file    FAMILY HISTORY: Family History  Problem Relation Age of Onset  . Diabetes Father   . Hypertension Father   . Cancer Maternal Grandfather     renal    ALLERGIES:  has No Known Allergies.  MEDICATIONS:  Current Outpatient Prescriptions  Medication Sig Dispense Refill  . busPIRone (BUSPAR) 10 MG tablet Take 1 tablet (10 mg total) by mouth 2 (two) times daily. 60 tablet 1  . escitalopram (LEXAPRO) 10 MG tablet Take 1 tablet (10 mg total) by mouth daily. 30 tablet 5  . traZODone (DESYREL) 50 MG tablet Take 0.5-1 tablets (25-50 mg total) by mouth at bedtime as needed for sleep. 30 tablet 3   No current facility-administered medications for this visit.    Review of Systems  Constitutional: Negative.   HENT: Negative.   Eyes:  Negative.   Respiratory: Negative.   Cardiovascular: Negative.   Gastrointestinal: Negative.   Genitourinary: Negative.   Musculoskeletal: Positive for back pain (lower).  Skin: Negative.   Neurological: Negative.   Endo/Heme/Allergies: Negative.   Psychiatric/Behavioral: The patient is nervous/anxious (health-related).   All other systems reviewed and are negative. 14 point ROS was done and is otherwise as detailed above or in HPI  PHYSICAL EXAMINATION:  Vitals:   12/19/16 1210  BP: 140/82  Pulse: 69  Resp: 18  Temp: 98.3 F (36.8 C)    Physical Exam  Constitutional: He is oriented to person, place, and time and well-developed, well-nourished, and in no distress. No distress.  HENT:  Head: Normocephalic and atraumatic.  Mouth/Throat: No oropharyngeal exudate.  Eyes: Conjunctivae and EOM are normal. Pupils are equal, round, and reactive to light. No scleral icterus.  Neck: Normal range of motion. Neck supple. No JVD present.  Cardiovascular: Normal rate, regular rhythm and normal heart sounds.  Exam reveals no gallop and no friction rub.   No murmur heard. Pulmonary/Chest: Effort normal and breath sounds normal. No respiratory distress. He has no wheezes. He has no rales.  Abdominal: Soft. Bowel sounds are normal.  Musculoskeletal: Normal range of motion. He exhibits no edema or tenderness.  Neurological: He is alert and oriented to person, place, and time. No cranial nerve deficit.  Gait normal.  Skin: Skin is warm and dry. No rash noted. No erythema. No pallor.  Psychiatric: Affect and judgment normal.  Nursing note and vitals reviewed.  LABORATORY DATA:  I have reviewed the data as listed Lab Results  Component Value Date   WBC 8.7 12/09/2016   HGB 15.4 12/09/2016   HCT 43.6 12/09/2016   MCV 84.2 12/09/2016   PLT 451 (H) 12/09/2016   CMP     Component Value Date/Time   NA 136 12/09/2016 0911   NA 140 10/31/2016 1711   K 4.3 12/09/2016 0911   CL 102  12/09/2016 0911   CO2 26 12/09/2016 0911   GLUCOSE 109 (H) 12/09/2016 0911   BUN 12 12/09/2016 0911   BUN 13 10/31/2016 1711   CREATININE 0.67 12/09/2016 0911   CALCIUM 10.0 12/09/2016 0911   PROT 8.6 (H) 12/09/2016 0911   PROT 8.2 10/31/2016 1711   ALBUMIN 4.8 12/09/2016 0911   ALBUMIN 5.1 10/31/2016 1711   AST 26 12/09/2016 0911   ALT 33 12/09/2016 0911   ALKPHOS 76 12/09/2016 0911   BILITOT 0.9 12/09/2016 0911   BILITOT 0.6 10/31/2016 1711   GFRNONAA >60 12/09/2016 0911   GFRAA >60 12/09/2016 0911   BCR-ABL1/JAK2 testing 12/09/2016  RT-PCR assay negative for b2a2, b3a2, and e1a2 genes CALR mutation - negative JAK2 - negative MPL mutation - negative  RADIOGRAPHIC STUDIES: I have personally reviewed the radiological images as listed and agreed with the findings in the report. No results found.  ASSESSMENT & PLAN:  21 year old male presents for evaluation of new leukocytosis, which has been trending down. He also has a thrombocytosis which is likely reactive.   I have reviewed patient's leukocytosis workup in detail with him and his mother today. His leukocytosis had resolved with repeat CBC, down to 8.7k. BCR-ABL was negative for CML. JAK2 V617 with reflex to exon 12, CALR, MPL were all negative for myeloproliferative disorder.   I have assured him that his mild thrombocytosis is reactive and not due to essential thrombocytosis since the JAK2 mutation was negative. I have recommended for him to have his PCP repeat his CBC in 3 months to assure that the platelet count goes back down to normal range.   Follow up with PCP for elevated BP.   Follow up with lab work with PCP. He will return PRN.   All questions were answered. The patient knows to call the clinic with any problems, questions or concerns.  This document serves as a record of services personally performed by Twana First, MD. It was created on her behalf by Martinique Casey, a trained medical scribe. The creation of  this record is based on the scribe's personal observations and the provider's statements to them. This document has been checked and approved by the attending provider.  I have reviewed the above documentation for accuracy and completeness and I agree with the above.  This note was electronically signed.    Martinique M Casey  12/19/2016 12:15 PM

## 2017-01-05 ENCOUNTER — Encounter: Payer: Self-pay | Admitting: Family Medicine

## 2017-01-08 ENCOUNTER — Ambulatory Visit (HOSPITAL_COMMUNITY): Payer: Self-pay | Admitting: Oncology

## 2017-01-31 ENCOUNTER — Other Ambulatory Visit: Payer: Self-pay | Admitting: Family Medicine

## 2017-01-31 DIAGNOSIS — F411 Generalized anxiety disorder: Secondary | ICD-10-CM

## 2017-03-30 ENCOUNTER — Telehealth: Payer: Self-pay

## 2017-03-30 DIAGNOSIS — F419 Anxiety disorder, unspecified: Secondary | ICD-10-CM

## 2017-04-06 ENCOUNTER — Other Ambulatory Visit: Payer: Self-pay

## 2017-04-06 DIAGNOSIS — F419 Anxiety disorder, unspecified: Secondary | ICD-10-CM

## 2017-04-06 MED ORDER — ESCITALOPRAM OXALATE 10 MG PO TABS: 10.0000 mg | ORAL_TABLET | Freq: Every day | ORAL | 0 refills | Status: DC

## 2017-04-06 NOTE — Telephone Encounter (Signed)
Last seen 2./23/18  Dr Dettinger  Wanting a 25 day supply

## 2017-04-08 NOTE — Telephone Encounter (Signed)
rx refill done

## 2017-05-26 ENCOUNTER — Other Ambulatory Visit: Payer: Self-pay | Admitting: Family Medicine

## 2017-05-26 DIAGNOSIS — F411 Generalized anxiety disorder: Secondary | ICD-10-CM

## 2017-05-26 NOTE — Telephone Encounter (Signed)
Last seen 12/05/16  Dr Dettinger

## 2017-07-26 ENCOUNTER — Other Ambulatory Visit: Payer: Self-pay | Admitting: Family Medicine

## 2017-07-26 DIAGNOSIS — F419 Anxiety disorder, unspecified: Secondary | ICD-10-CM

## 2018-06-15 DIAGNOSIS — E860 Dehydration: Secondary | ICD-10-CM | POA: Diagnosis not present

## 2018-06-15 DIAGNOSIS — K529 Noninfective gastroenteritis and colitis, unspecified: Secondary | ICD-10-CM | POA: Diagnosis not present

## 2018-06-16 ENCOUNTER — Ambulatory Visit: Payer: 59 | Admitting: Physician Assistant

## 2018-08-30 ENCOUNTER — Other Ambulatory Visit: Payer: Self-pay | Admitting: Family Medicine

## 2018-08-30 DIAGNOSIS — F419 Anxiety disorder, unspecified: Secondary | ICD-10-CM

## 2018-08-30 DIAGNOSIS — F411 Generalized anxiety disorder: Secondary | ICD-10-CM

## 2018-09-01 ENCOUNTER — Encounter: Payer: Self-pay | Admitting: *Deleted

## 2018-09-01 ENCOUNTER — Encounter: Payer: Self-pay | Admitting: Physician Assistant

## 2018-09-01 ENCOUNTER — Other Ambulatory Visit: Payer: Self-pay | Admitting: Family Medicine

## 2018-09-01 ENCOUNTER — Ambulatory Visit: Payer: 59 | Admitting: Physician Assistant

## 2018-09-01 VITALS — BP 130/75 | HR 72 | Temp 97.3°F | Ht 68.0 in | Wt 261.4 lb

## 2018-09-01 DIAGNOSIS — G4701 Insomnia due to medical condition: Secondary | ICD-10-CM | POA: Diagnosis not present

## 2018-09-01 DIAGNOSIS — D72829 Elevated white blood cell count, unspecified: Secondary | ICD-10-CM | POA: Diagnosis not present

## 2018-09-01 DIAGNOSIS — H9312 Tinnitus, left ear: Secondary | ICD-10-CM | POA: Diagnosis not present

## 2018-09-01 DIAGNOSIS — R5383 Other fatigue: Secondary | ICD-10-CM | POA: Diagnosis not present

## 2018-09-01 DIAGNOSIS — G4489 Other headache syndrome: Secondary | ICD-10-CM | POA: Diagnosis not present

## 2018-09-01 DIAGNOSIS — F419 Anxiety disorder, unspecified: Secondary | ICD-10-CM

## 2018-09-01 DIAGNOSIS — F411 Generalized anxiety disorder: Secondary | ICD-10-CM

## 2018-09-01 MED ORDER — TRAZODONE HCL 50 MG PO TABS: 25.0000 mg | ORAL_TABLET | Freq: Every evening | ORAL | 3 refills | Status: AC | PRN

## 2018-09-01 MED ORDER — ESCITALOPRAM OXALATE 20 MG PO TABS: 20.0000 mg | ORAL_TABLET | Freq: Every day | ORAL | 5 refills | Status: DC

## 2018-09-01 MED ORDER — GABAPENTIN 100 MG PO CAPS: 100.0000 mg | ORAL_CAPSULE | Freq: Every day | ORAL | 3 refills | Status: AC

## 2018-09-01 NOTE — Progress Notes (Signed)
BP 130/75   Pulse 72   Temp (!) 97.3 F (36.3 C) (Oral)   Ht 5' 8"  (1.727 m)   Wt 261 lb 6.4 oz (118.6 kg)   BMI 39.75 kg/m    Subjective:    Patient ID: Thomas Mercer, male    DOB: Sep 24, 1996, 22 y.o.   MRN: [id number]  HPI: Thomas Mercer is a 22 y.o. male presenting on 09/01/2018 for Tinnitus (constant and loud ) and Muscle Twitching (x 6 months comes and goes )  Maudie Mercury, mother is present, and on his HIPAA  The patient comes in today with his mother having multiple complaints.  He states that probably for the last 5 to 6 years he has had recurrent tinnitus.  Is worse in his left ear.  He will notice it more when things are quiet and still.  He has had a chronic low-grade headache also on this side.  Years ago he did have tubes placed due to recurrent otitis media.  He has had no other head injury.  He does have a history of exposure to gun shooting without protective head wear.  He does use that now.  He denies any other loud noise injury to his ears.  He is also having significant twitching of muscles in various places.  Is having extremely bad time with sleep.  The ringing the ears will also keep him awake.  We reviewed all of his labs and medications.  Refills will be sent today.  We will plan to have radiology evaluation of his head.  History reviewed. No pertinent past medical history. Relevant past medical, surgical, family and social history reviewed and updated as indicated. Interim medical history since our last visit reviewed. Allergies and medications reviewed and updated. DATA REVIEWED: CHART IN EPIC  Family History reviewed for pertinent findings.  Review of Systems  Constitutional: Positive for fatigue. Negative for appetite change and fever.  HENT: Positive for tinnitus. Negative for sneezing and sore throat.   Eyes: Negative.  Negative for photophobia, pain and visual disturbance.  Respiratory: Negative.  Negative for cough, chest tightness, shortness of  breath and wheezing.   Cardiovascular: Negative.  Negative for chest pain, palpitations and leg swelling.  Gastrointestinal: Negative.  Negative for abdominal pain, diarrhea, nausea and vomiting.  Endocrine: Negative.   Genitourinary: Negative.   Musculoskeletal: Negative.   Skin: Negative.  Negative for color change and rash.  Neurological: Positive for dizziness, tremors, weakness and headaches. Negative for syncope and numbness.  Psychiatric/Behavioral: Negative.     Allergies as of 09/01/2018      Reactions   Buspar [buspirone] Other (See Comments)   Headaches       Medication List        Accurate as of 09/01/18  5:18 PM. Always use your most recent med list.          escitalopram 20 MG tablet Commonly known as:  LEXAPRO Take 1 tablet (20 mg total) by mouth daily.   gabapentin 100 MG capsule Commonly known as:  NEURONTIN Take 1 capsule (100 mg total) by mouth at bedtime.   traZODone 50 MG tablet Commonly known as:  DESYREL Take 0.5-1 tablets (25-50 mg total) by mouth at bedtime as needed for sleep.          Objective:    BP 130/75   Pulse 72   Temp (!) 97.3 F (36.3 C) (Oral)   Ht 5' 8"  (1.727 m)   Wt 261 lb 6.4 oz (  118.6 kg)   BMI 39.75 kg/m   Allergies  Allergen Reactions  . Buspar [Buspirone] Other (See Comments)    Headaches     Wt Readings from Last 3 Encounters:  09/01/18 261 lb 6.4 oz (118.6 kg)  12/19/16 239 lb 11.2 oz (108.7 kg)  12/09/16 238 lb 4.8 oz (108.1 kg)    Physical Exam  Constitutional: He appears well-developed and well-nourished.  HENT:  Head: Normocephalic and atraumatic.  Eyes: Pupils are equal, round, and reactive to light. Conjunctivae and EOM are normal.  Neck: Normal range of motion. Neck supple.  Cardiovascular: Normal rate, regular rhythm and normal heart sounds.  Pulmonary/Chest: Effort normal and breath sounds normal.  Abdominal: Soft. Bowel sounds are normal.  Musculoskeletal: Normal range of motion.  Skin:  Skin is warm and dry.        Assessment & Plan:   1. 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 Other headache syndrome - MR Brain Wo Contrast; Future  4. Fatigue, unspecified type - CMP14+EGFR - CBC with Differential/Platelet - TSH - Vitamin B12  5. Leukocytosis, unspecified type - CMP14+EGFR - CBC with Differential/Platelet - TSH - Vitamin B12  6. Anxiety state - traZODone (DESYREL) 50 MG tablet; Take 0.5-1 tablets (25-50 mg total) by mouth at bedtime as needed for sleep.  Dispense: 30 tablet; Refill: 3  7. Anxiety - escitalopram (LEXAPRO) 20 MG tablet; Take 1 tablet (20 mg total) by mouth daily.  Dispense: 30 tablet; Refill: 5   Continue all other maintenance medications as listed above.  Follow up plan: Return in about 4 weeks (around 09/29/2018).  Educational handout given for [place] PA-C [facility] [address]  [place], [place] [address] [phone]   09/01/2018, 5:18 PM

## 2018-09-02 ENCOUNTER — Encounter: Payer: Self-pay | Admitting: Physician Assistant

## 2018-09-02 LAB — VITAMIN B12: Vitamin B-12: 1633 pg/mL — ABNORMAL HIGH (ref 232–1245)

## 2018-09-02 LAB — CMP14+EGFR
A/G RATIO: 1.7 (ref 1.2–2.2)
ALK PHOS: 89 IU/L (ref 39–117)
ALT: 71 IU/L — ABNORMAL HIGH (ref 0–44)
AST: 44 IU/L — AB (ref 0–40)
Albumin: 5.2 g/dL (ref 3.5–5.5)
BILIRUBIN TOTAL: 0.7 mg/dL (ref 0.0–1.2)
BUN/Creatinine Ratio: 17 (ref 9–20)
BUN: 12 mg/dL (ref 6–20)
CALCIUM: 10.8 mg/dL — AB (ref 8.7–10.2)
CHLORIDE: 95 mmol/L — AB (ref 96–106)
CO2: 20 mmol/L (ref 20–29)
Creatinine, Ser: 0.72 mg/dL — ABNORMAL LOW (ref 0.76–1.27)
GFR calc Af Amer: 154 mL/min/{1.73_m2} (ref >59)
GFR, EST NON AFRICAN AMERICAN: 133 mL/min/{1.73_m2} (ref >59)
Globulin, Total: 3 g/dL (ref 1.5–4.5)
Glucose: 86 mg/dL (ref 65–99)
POTASSIUM: 4.5 mmol/L (ref 3.5–5.2)
SODIUM: 137 mmol/L (ref 134–144)
Total Protein: 8.2 g/dL (ref 6.0–8.5)

## 2018-09-02 LAB — CBC WITH DIFFERENTIAL/PLATELET
BASOS: 1 %
Basophils Absolute: 0.1 10*3/uL (ref 0.0–0.2)
EOS (ABSOLUTE): 0 10*3/uL (ref 0.0–0.4)
Eos: 0 %
Hematocrit: 42.7 % (ref 37.5–51.0)
Hemoglobin: 15.2 g/dL (ref 13.0–17.7)
IMMATURE GRANULOCYTES: 0 %
Immature Grans (Abs): 0 10*3/uL (ref 0.0–0.1)
LYMPHS ABS: 2.3 10*3/uL (ref 0.7–3.1)
Lymphs: 22 %
MCH: 29 pg (ref 26.6–33.0)
MCHC: 35.6 g/dL (ref 31.5–35.7)
MCV: 81 fL (ref 79–97)
MONOS ABS: 0.7 10*3/uL (ref 0.1–0.9)
Monocytes: 7 %
NEUTROS PCT: 70 %
Neutrophils Absolute: 7.2 10*3/uL — ABNORMAL HIGH (ref 1.4–7.0)
PLATELETS: 527 10*3/uL — AB (ref 150–450)
RBC: 5.25 x10E6/uL (ref 4.14–5.80)
RDW: 12.5 % (ref 12.3–15.4)
WBC: 10.3 10*3/uL (ref 3.4–10.8)

## 2018-09-02 LAB — TSH: TSH: 1.44 u[IU]/mL (ref 0.450–4.500)

## 2018-09-10 ENCOUNTER — Ambulatory Visit (HOSPITAL_COMMUNITY)
Admission: RE | Admit: 2018-09-10 | Discharge: 2018-09-10 | Disposition: A | Discharge status: Home / Self Care | Payer: 59 | Source: Ambulatory Visit | Attending: Physician Assistant | Admitting: Physician Assistant

## 2018-09-10 DIAGNOSIS — R9082 White matter disease, unspecified: Secondary | ICD-10-CM | POA: Diagnosis not present

## 2018-09-10 DIAGNOSIS — R42 Dizziness and giddiness: Secondary | ICD-10-CM | POA: Diagnosis not present

## 2018-09-10 DIAGNOSIS — G4489 Other headache syndrome: Secondary | ICD-10-CM | POA: Diagnosis not present

## 2018-10-29 ENCOUNTER — Ambulatory Visit: Payer: 59 | Admitting: Physician Assistant

## 2018-12-08 ENCOUNTER — Other Ambulatory Visit: Payer: Self-pay | Admitting: Physician Assistant

## 2018-12-08 DIAGNOSIS — F419 Anxiety disorder, unspecified: Secondary | ICD-10-CM

## 2018-12-08 DIAGNOSIS — F411 Generalized anxiety disorder: Secondary | ICD-10-CM

## 2019-03-27 ENCOUNTER — Other Ambulatory Visit: Payer: Self-pay | Admitting: Physician Assistant

## 2019-03-27 DIAGNOSIS — F419 Anxiety disorder, unspecified: Secondary | ICD-10-CM

## 2019-04-07 ENCOUNTER — Other Ambulatory Visit: Payer: Self-pay | Admitting: Physician Assistant

## 2019-04-07 DIAGNOSIS — F419 Anxiety disorder, unspecified: Secondary | ICD-10-CM

## 2019-04-08 ENCOUNTER — Other Ambulatory Visit: Payer: Self-pay | Admitting: Physician Assistant

## 2019-04-08 DIAGNOSIS — F419 Anxiety disorder, unspecified: Secondary | ICD-10-CM

## 2019-05-30 IMAGING — MR MR HEAD W/O CM
8 of 11 series · 30 of 48 positions shown · non-contrast
Comparison: None.

CLINICAL DATA: Tinnitus and dizziness

EXAM:
MRI HEAD WITHOUT CONTRAST
TECHNIQUE: Multiplanar, multiecho pulse sequences of the brain and surrounding
structures were obtained without intravenous contrast.

[Series 2: DWI · axial · 3.0mm · 0.80mm/px · z∈[-64,+98]mm · 6 of 55 slices shown (1 of 4)]
[im 1/55]
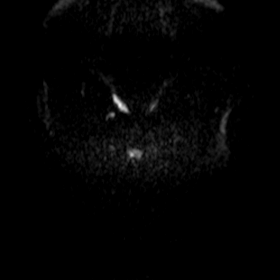
[im 11/55]
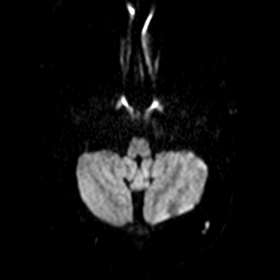
[im 22/55]
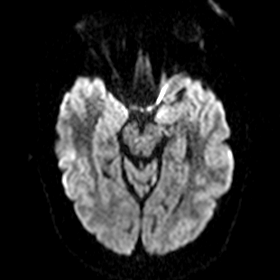
[im 33/55]
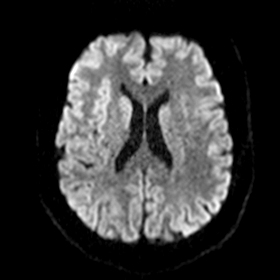
[im 44/55]
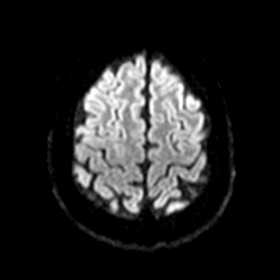
[im 55/55]
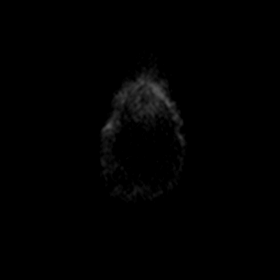

[Series 3: DWI · axial · 3.0mm · 0.80mm/px · z∈[-64,+98]mm · 5 of 55 slices shown (2 of 4)]
[im 1/55]
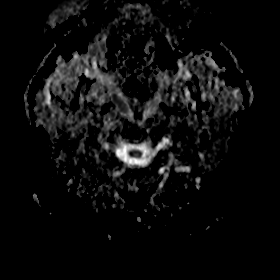
[im 14/55]
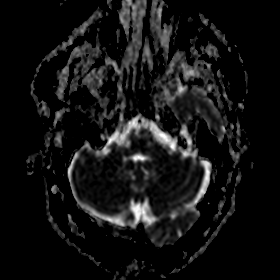
[im 28/55]
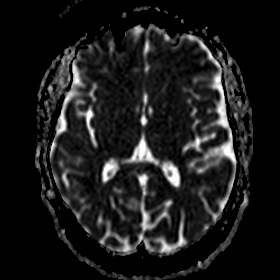
[im 41/55]
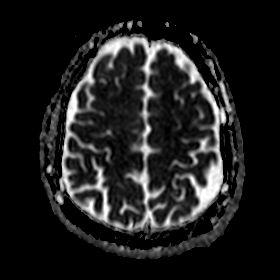
[im 55/55]
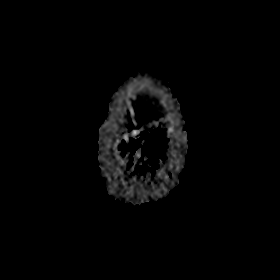

[Series 4: DWI · coronal · 5.0mm · 0.52mm/px · 4 of 40 slices shown (3 of 4)]
[im 1/40]
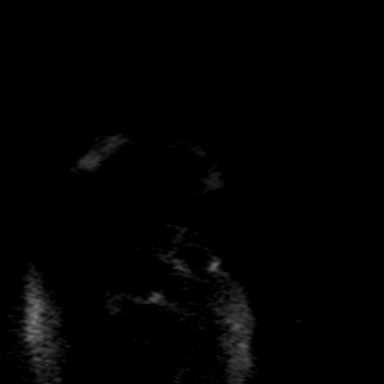
[im 14/40]
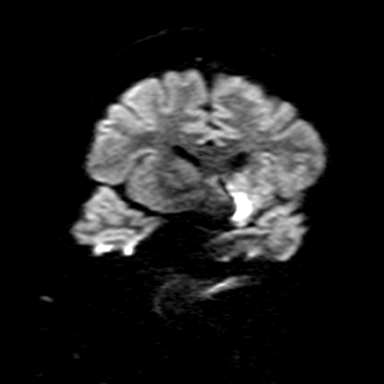
[im 27/40]
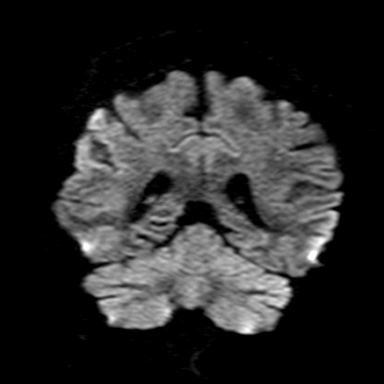
[im 40/40]
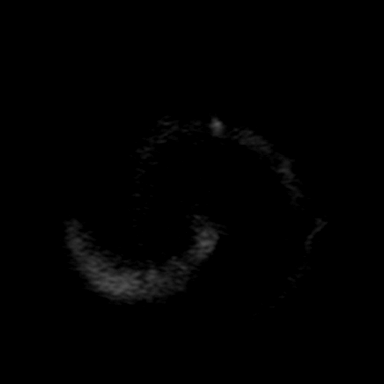

[Series 5: DWI · coronal · 5.0mm · 0.52mm/px · 4 of 40 slices shown (4 of 4)]
[im 1/40]
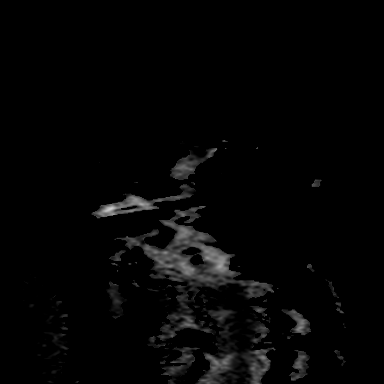
[im 14/40]
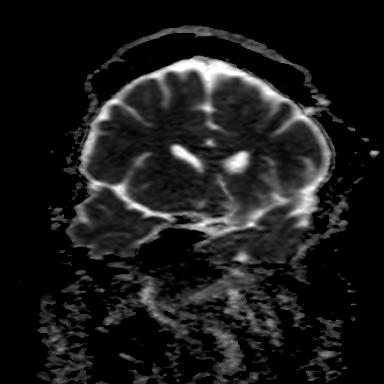
[im 27/40]
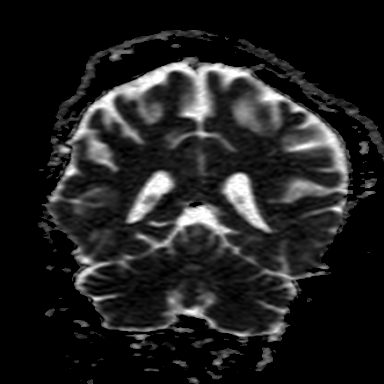
[im 40/40]
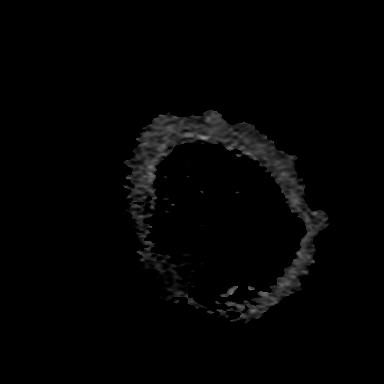

[Series 6: T1 · sagittal · 5.0mm · 0.47mm/px · 2 of 25 slices shown]
[im 1/25]
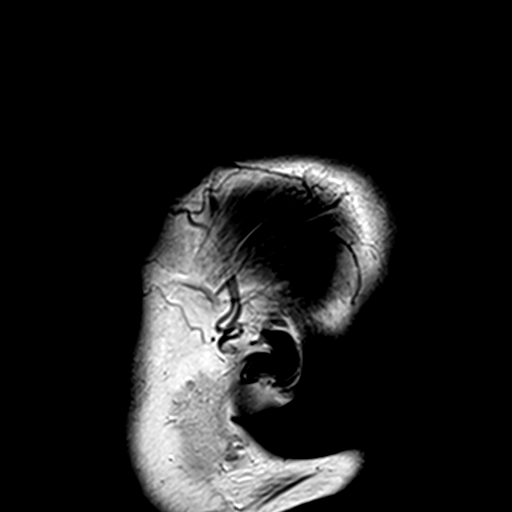
[im 25/25]
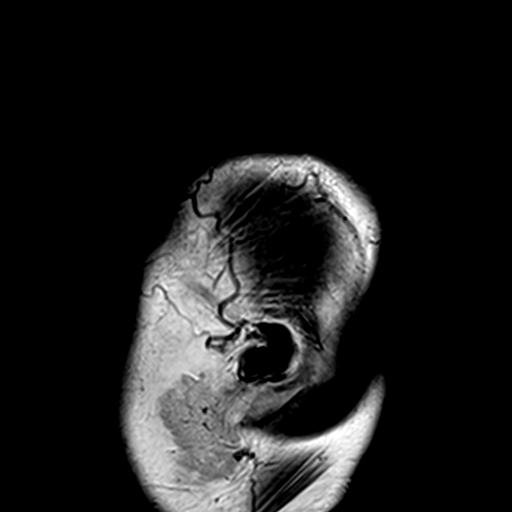

[Series 7: T2 · axial · 5.0mm · 0.53mm/px · z∈[-49,+93]mm · 2 of 23 slices shown (1 of 2)]
[im 1/23]
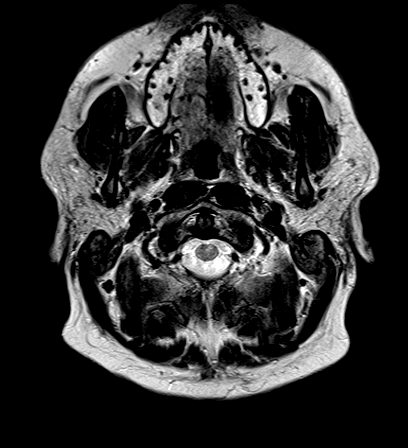
[im 23/23]
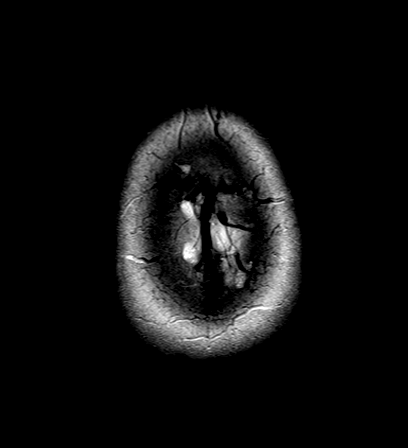

[Series 8: FLAIR · axial · 3.0mm · 0.37mm/px · z∈[-45,+90]mm · 4 of 46 slices shown]
[im 1/46]
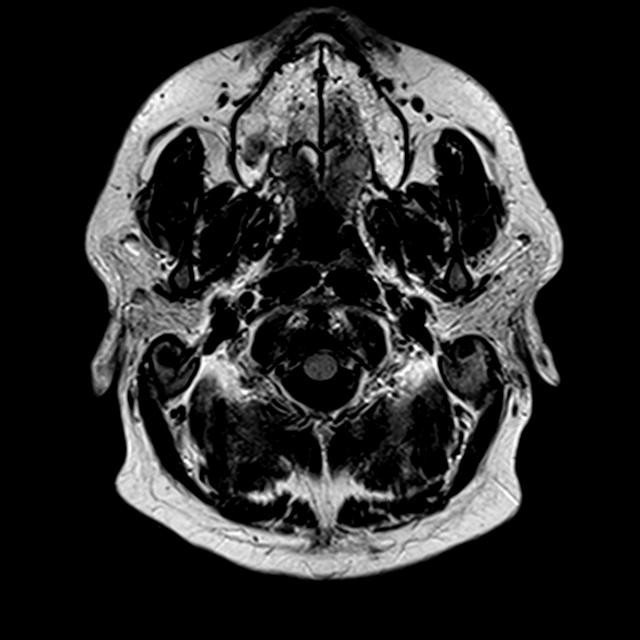
[im 16/46]
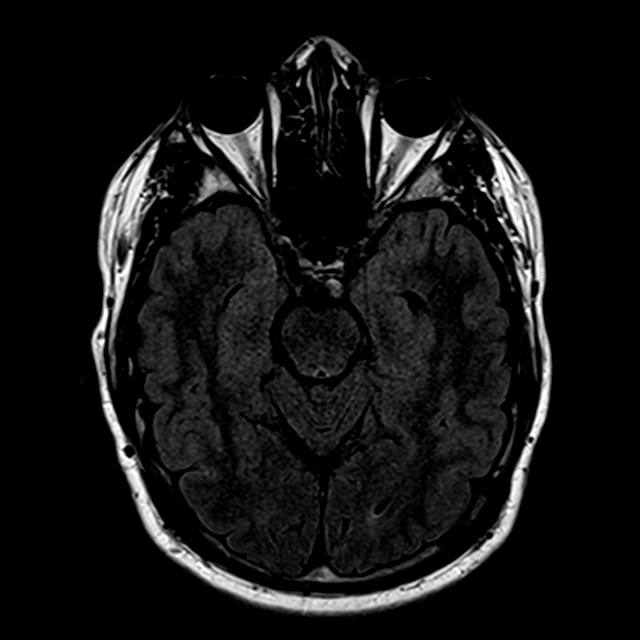
[im 31/46]
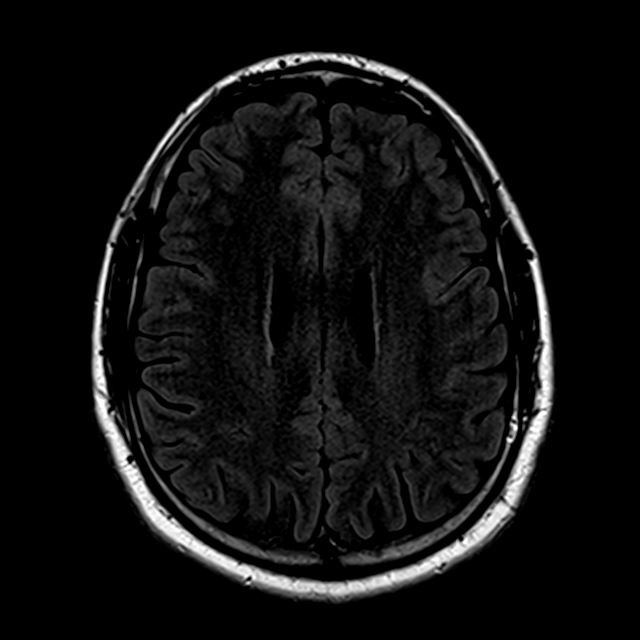
[im 46/46]
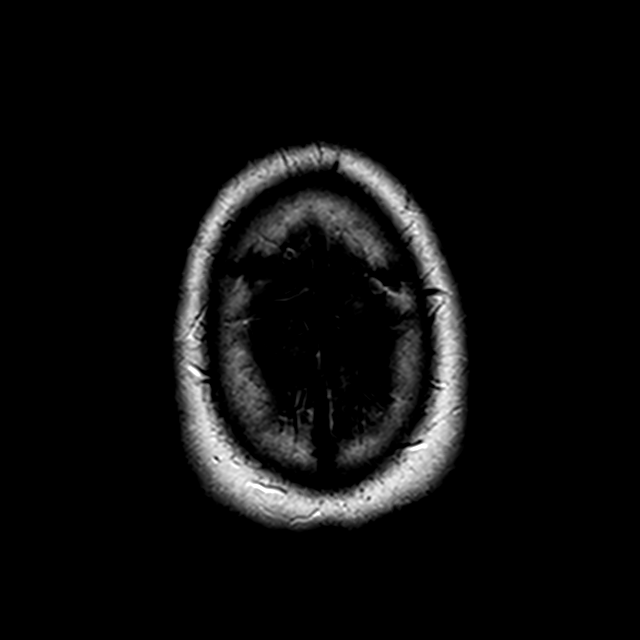

[Series 11: T2 · coronal · 5.0mm · 0.44mm/px · 3 of 30 slices shown (2 of 2)]
[im 1/30]
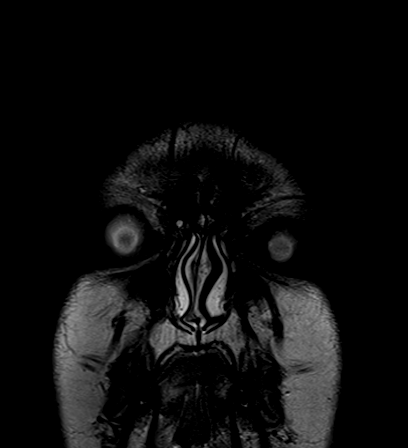
[im 15/30]
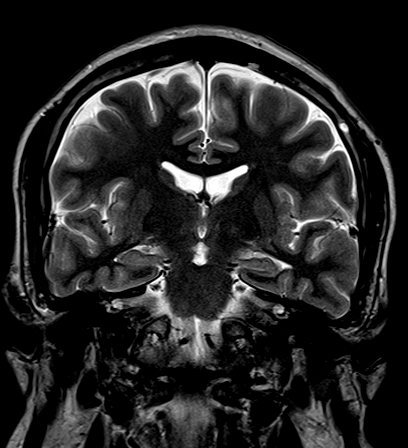
[im 30/30]
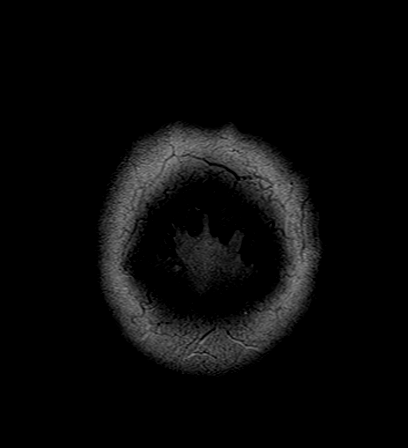

[30 of 48 positions shown; findings below may reference images not displayed]

FINDINGS: BRAIN: There is no acute infarct, acute hemorrhage, hydrocephalus or
extra-axial collection. The midline structures are normal. No
midline shift or other mass effect. There are no old infarcts.
Multifocal punctate white matter hyperintensity within the bilateral
anterior frontal lobes. the cerebral and cerebellar volume are
age-appropriate. Susceptibility-sensitive sequences show no chronic
microhemorrhage or superficial siderosis.

INTERNAL AUDITORY CANALS: There is no cerebellopontine angle mass.
The cochleae and semicircular canals are normal. No focal
abnormality along the course of the 7th and 8th cranial nerves.
Normal porus acusticus and vestibular aqueduct bilaterally.

VASCULAR: Major intracranial arterial and venous sinus flow voids
are normal.

SKULL AND UPPER CERVICAL SPINE: Calvarial bone marrow signal is
normal. There is no skull base mass. Visualized upper cervical spine
and soft tissues are normal.

SINUSES/ORBITS: No fluid levels or advanced mucosal thickening. No
mastoid or middle ear effusion. The orbits are normal.
IMPRESSION: 1. Normal internal auditory canals, labyrinthine structures and
cranial nerve [DATE] apparatus.
2. Multifocal punctate white matter hyperintensity in the frontal
lobes, nonspecific. This may be seen in the setting of migraine
headaches.

## 2020-03-08 ENCOUNTER — Ambulatory Visit (INDEPENDENT_AMBULATORY_CARE_PROVIDER_SITE_OTHER): Payer: 59 | Admitting: Family Medicine

## 2020-03-08 DIAGNOSIS — R351 Nocturia: Secondary | ICD-10-CM | POA: Diagnosis not present

## 2020-03-08 NOTE — Progress Notes (Signed)
Virtual Visit via telephone Note  I connected with Thomas Mercer on 03/08/20 at 0855 by telephone and verified that I am speaking with the correct person using two identifiers. Thomas Mercer is currently located at home and no other people are currently with her during visit. The provider, Fransisca Kaufmann Detron Carras, MD is located in their office at time of visit.  Call ended at 0905  I discussed the limitations, risks, security and privacy concerns of performing an evaluation and management service by telephone and the availability of in person appointments. I also discussed with the patient that there may be a patient responsible charge related to this service. The patient expressed understanding and agreed to proceed.   History and Present Illness: Patient is calling in for urinating at bed @ night.  He has a lot of anxiety.  He started back up 2 weeks where he urinating the bed at night. He denies any dysuria. He started a new job and it is a longer shift and has a long drive and has more stressful.  He has been having some stress but not as much as previously.  He denies any change in diet. He avoids most caffeine.  Patient had 2 episodes of this over the past couple weeks.  He had prior issues with this a year ago when he was having a lot of stress and anxiety but he denies the stress and anxiety being as high right now and he says his new job is actually going well although he does have to learn a lot.  No diagnosis found.  Outpatient Encounter Medications as of 03/08/2020  Medication Sig  . escitalopram (LEXAPRO) 20 MG tablet TAKE 1 TABLET BY MOUTH DAILY. (NEEDS TO BE SEEN BEFORE NEXT REFILL)  . gabapentin (NEURONTIN) 100 MG capsule Take 1 capsule (100 mg total) by mouth at bedtime.  . traZODone (DESYREL) 50 MG tablet Take 0.5-1 tablets (25-50 mg total) by mouth at bedtime as needed for sleep.   No facility-administered encounter medications on file as of 03/08/2020.    Review of  Systems  Constitutional: Negative for chills and fever.  Respiratory: Negative for shortness of breath and wheezing.   Cardiovascular: Negative for chest pain and leg swelling.  Gastrointestinal: Negative for abdominal pain.  Genitourinary: Negative for decreased urine volume, difficulty urinating, dysuria, flank pain, frequency and urgency.  Musculoskeletal: Negative for back pain and gait problem.  Skin: Negative for rash.  All other systems reviewed and are negative.   Observations/Objective: Patient sounds comfortable and in no acute distress  Assessment and Plan: Problem List Items Addressed This Visit    None    Visit Diagnoses    Nocturia    -  Primary   Relevant Orders   CMP14+EGFR   TSH   CBC with Differential/Platelet   Urine Culture   Urinalysis, Complete      Will check blood work and make sure there is no diabetes or thyroid disease or any urinary infection and if that comes back negative then we may discuss going to see a urologist versus discussing anxiety. Follow up plan: Return if symptoms worsen or fail to improve.     I discussed the assessment and treatment plan with the patient. The patient was provided an opportunity to ask questions and all were answered. The patient agreed with the plan and demonstrated an understanding of the instructions.   The patient was advised to call back or seek an in-person evaluation if the symptoms worsen or  if the condition fails to improve as anticipated.  The above assessment and management plan was discussed with the patient. The patient verbalized understanding of and has agreed to the management plan. Patient is aware to call the clinic if symptoms persist or worsen. Patient is aware when to return to the clinic for a follow-up visit. Patient educated on when it is appropriate to go to the emergency department.    I provided 10 minutes of non-face-to-face time during this encounter.    Worthy Rancher, MD

## 2024-03-09 ENCOUNTER — Ambulatory Visit: Payer: Self-pay | Admitting: Family Medicine

## 2024-05-02 ENCOUNTER — Ambulatory Visit: Admitting: Nurse Practitioner

## 2024-07-13 ENCOUNTER — Ambulatory Visit: Admitting: Nurse Practitioner
